# Patient Record
Sex: Female | Born: 1970 | Race: Black or African American | Hispanic: No | Marital: Single | Smoking: Never smoker
Health system: Southern US, Community
[De-identification: ages and names within clinical notes are randomized; demographics above are authoritative.]

## PROBLEM LIST (undated history)

## (undated) DIAGNOSIS — E079 Disorder of thyroid, unspecified: Secondary | ICD-10-CM

## (undated) DIAGNOSIS — I10 Essential (primary) hypertension: Secondary | ICD-10-CM

## (undated) HISTORY — DX: Essential (primary) hypertension: I10

## (undated) HISTORY — DX: Disorder of thyroid, unspecified: E07.9

---

## 2005-11-12 ENCOUNTER — Emergency Department: Payer: Self-pay | Admitting: Emergency Medicine

## 2006-09-04 ENCOUNTER — Ambulatory Visit: Payer: Self-pay | Admitting: General Surgery

## 2006-12-26 ENCOUNTER — Ambulatory Visit: Payer: Self-pay | Admitting: Family Medicine

## 2009-02-25 ENCOUNTER — Ambulatory Visit: Payer: Self-pay | Admitting: Oncology

## 2009-03-04 ENCOUNTER — Ambulatory Visit: Payer: Self-pay | Admitting: Oncology

## 2009-03-28 ENCOUNTER — Ambulatory Visit: Payer: Self-pay | Admitting: Oncology

## 2009-05-26 ENCOUNTER — Ambulatory Visit: Payer: Self-pay | Admitting: Oncology

## 2009-06-04 ENCOUNTER — Ambulatory Visit: Payer: Self-pay | Admitting: Oncology

## 2009-06-26 ENCOUNTER — Ambulatory Visit: Payer: Self-pay | Admitting: Oncology

## 2011-01-27 ENCOUNTER — Ambulatory Visit: Payer: Self-pay | Admitting: Family Medicine

## 2011-07-08 ENCOUNTER — Ambulatory Visit: Payer: Self-pay | Admitting: Family Medicine

## 2012-02-24 ENCOUNTER — Ambulatory Visit: Payer: Self-pay | Admitting: Family Medicine

## 2012-03-15 ENCOUNTER — Ambulatory Visit: Payer: Self-pay | Admitting: Family Medicine

## 2013-03-18 ENCOUNTER — Ambulatory Visit: Payer: Self-pay | Admitting: Family Medicine

## 2014-03-19 ENCOUNTER — Ambulatory Visit: Payer: Self-pay | Admitting: Family Medicine

## 2016-08-15 ENCOUNTER — Other Ambulatory Visit: Payer: Self-pay | Admitting: Family Medicine

## 2016-08-15 DIAGNOSIS — Z0000 Encounter for general adult medical examination without abnormal findings: Secondary | ICD-10-CM

## 2016-10-18 ENCOUNTER — Ambulatory Visit
Admission: RE | Admit: 2016-10-18 | Discharge: 2016-10-18 | Disposition: A | Discharge status: Home / Self Care | Payer: BLUE CROSS/BLUE SHIELD | Source: Ambulatory Visit | Attending: Family Medicine | Admitting: Family Medicine

## 2016-10-18 ENCOUNTER — Encounter: Payer: Self-pay | Admitting: Radiology

## 2016-10-18 DIAGNOSIS — Z1231 Encounter for screening mammogram for malignant neoplasm of breast: Secondary | ICD-10-CM | POA: Insufficient documentation

## 2016-10-18 DIAGNOSIS — Z0000 Encounter for general adult medical examination without abnormal findings: Secondary | ICD-10-CM

## 2018-03-02 ENCOUNTER — Other Ambulatory Visit: Payer: Self-pay | Admitting: Family Medicine

## 2018-03-02 DIAGNOSIS — Z1231 Encounter for screening mammogram for malignant neoplasm of breast: Secondary | ICD-10-CM

## 2018-04-12 ENCOUNTER — Ambulatory Visit
Admission: RE | Admit: 2018-04-12 | Discharge: 2018-04-12 | Disposition: A | Discharge status: Home / Self Care | Payer: BLUE CROSS/BLUE SHIELD | Source: Ambulatory Visit | Attending: Family Medicine | Admitting: Family Medicine

## 2018-04-12 DIAGNOSIS — Z1231 Encounter for screening mammogram for malignant neoplasm of breast: Secondary | ICD-10-CM

## 2019-04-17 ENCOUNTER — Other Ambulatory Visit: Payer: Self-pay | Admitting: Family Medicine

## 2019-04-17 DIAGNOSIS — Z1231 Encounter for screening mammogram for malignant neoplasm of breast: Secondary | ICD-10-CM

## 2019-05-13 ENCOUNTER — Ambulatory Visit
Admission: RE | Admit: 2019-05-13 | Discharge: 2019-05-13 | Disposition: A | Discharge status: Home / Self Care | Payer: BC Managed Care – PPO | Source: Ambulatory Visit | Attending: Family Medicine | Admitting: Family Medicine

## 2019-05-13 DIAGNOSIS — Z1231 Encounter for screening mammogram for malignant neoplasm of breast: Secondary | ICD-10-CM | POA: Diagnosis not present

## 2020-04-08 ENCOUNTER — Other Ambulatory Visit: Payer: Self-pay | Admitting: Family Medicine

## 2020-04-08 DIAGNOSIS — Z1231 Encounter for screening mammogram for malignant neoplasm of breast: Secondary | ICD-10-CM

## 2020-05-18 ENCOUNTER — Other Ambulatory Visit: Payer: Self-pay

## 2020-05-18 ENCOUNTER — Ambulatory Visit
Admission: RE | Admit: 2020-05-18 | Discharge: 2020-05-18 | Disposition: A | Discharge status: Home / Self Care | Payer: BC Managed Care – PPO | Source: Ambulatory Visit | Attending: Family Medicine | Admitting: Family Medicine

## 2020-05-18 DIAGNOSIS — Z1231 Encounter for screening mammogram for malignant neoplasm of breast: Secondary | ICD-10-CM | POA: Diagnosis present

## 2022-02-01 ENCOUNTER — Encounter: Payer: Self-pay | Admitting: Family Medicine

## 2022-02-01 DIAGNOSIS — Z1231 Encounter for screening mammogram for malignant neoplasm of breast: Secondary | ICD-10-CM

## 2022-02-08 ENCOUNTER — Encounter: Payer: Self-pay | Admitting: Family Medicine

## 2022-03-16 ENCOUNTER — Other Ambulatory Visit: Payer: Self-pay

## 2022-03-16 DIAGNOSIS — Z1231 Encounter for screening mammogram for malignant neoplasm of breast: Secondary | ICD-10-CM

## 2022-04-12 ENCOUNTER — Ambulatory Visit: Payer: Self-pay

## 2022-05-02 ENCOUNTER — Ambulatory Visit: Payer: Self-pay

## 2022-05-23 ENCOUNTER — Ambulatory Visit
Admission: RE | Admit: 2022-05-23 | Discharge: 2022-05-23 | Disposition: A | Discharge status: Home / Self Care | Payer: Self-pay | Source: Ambulatory Visit | Attending: Obstetrics and Gynecology | Admitting: Obstetrics and Gynecology

## 2022-05-23 ENCOUNTER — Ambulatory Visit: Payer: Self-pay | Attending: Hematology and Oncology | Admitting: Hematology and Oncology

## 2022-05-23 VITALS — BP 138/81 | Wt 140.9 lb

## 2022-05-23 DIAGNOSIS — Z1231 Encounter for screening mammogram for malignant neoplasm of breast: Secondary | ICD-10-CM

## 2022-05-23 NOTE — Patient Instructions (Signed)
Taught Mary Wu Abide about self breast awareness and gave educational materials to take home. Patient did not need a Pap smear today due to last Pap smear was in 2021 per patient. Let her know [facility] will cover Pap smears every 5 years unless has a history of abnormal Pap smears. [facility]. Appointment scheduled for 05/23/22. Patient aware of appointment and will be there. Let patient know will follow up with her within the next couple weeks with results. Darely Lavette Nagy verbalized understanding.  [name], NP 3:04 PM

## 2022-05-23 NOTE — Progress Notes (Signed)
Mary Wu is a 52 y.o. female who presents to [facility] clinic today with no complaints.    Pap Smear: Pap not smear completed today. Last Pap smear was 2021 at [facility] clinic and was normal. She will follow with Mary Wu. Per patient has no history of an abnormal Pap smear. Last Pap smear result is available in Epic.   Physical exam: Breasts Breasts symmetrical. No skin abnormalities bilateral breasts. No nipple retraction bilateral breasts. No nipple discharge bilateral breasts. No lymphadenopathy. No lumps palpated bilateral breasts. MM 3D SCREEN BREAST BILATERAL  Result Date: 05/21/2020 CLINICAL DATA:  Screening. EXAM: DIGITAL SCREENING BILATERAL MAMMOGRAM WITH TOMOSYNTHESIS AND CAD TECHNIQUE: Bilateral screening digital craniocaudal and mediolateral oblique mammograms were obtained. Bilateral screening digital breast tomosynthesis was performed. The images were evaluated with computer-aided detection. COMPARISON:  Previous exam(s). ACR Breast Density Category b: There are scattered areas of fibroglandular density. FINDINGS: There are no findings suspicious for malignancy. IMPRESSION: No mammographic evidence of malignancy. A result letter of this screening mammogram will be mailed directly to the patient. RECOMMENDATION: Screening mammogram in one year. (Code:SM-B-01Y) BI-RADS CATEGORY  1: Negative. Electronically Signed   By: Claudie Revering M.D.   On: 05/21/2020 17:30   MM 3D SCREEN BREAST BILATERAL  Result Date: 05/14/2019 CLINICAL DATA:  Screening. EXAM: DIGITAL SCREENING BILATERAL MAMMOGRAM WITH TOMO AND CAD COMPARISON:  Previous exam(s). ACR Breast Density Category b: There are scattered areas of fibroglandular density. FINDINGS: There are no findings suspicious for malignancy. Images were processed with CAD. IMPRESSION: No mammographic evidence of malignancy. A result letter of this screening mammogram will be mailed directly to the patient. RECOMMENDATION: Screening  mammogram in one year. (Code:SM-B-01Y) BI-RADS CATEGORY  1: Negative. Electronically Signed   By: Nolon Nations M.D.   On: 05/14/2019 09:26   MM 3D SCREEN BREAST BILATERAL  Result Date: 04/13/2018 CLINICAL DATA:  Screening. EXAM: DIGITAL SCREENING BILATERAL MAMMOGRAM WITH TOMO AND CAD COMPARISON:  Previous exam(s). ACR Breast Density Category c: The breast tissue is heterogeneously dense, which may obscure small masses. FINDINGS: There are no findings suspicious for malignancy. Images were processed with CAD. IMPRESSION: No mammographic evidence of malignancy. A result letter of this screening mammogram will be mailed directly to the patient. RECOMMENDATION: Screening mammogram in one year. (Code:[id number]-01Y) BI-RADS CATEGORY  1: Negative. Electronically Signed   By: Lillia Mountain M.D.   On: 04/13/2018 10:05          Pelvic/Bimanual Pap is not indicated today    Smoking History: Patient has never smoked and was not referred to quit line.    Patient Navigation: Patient education provided. Access to services provided for patient through [facility] program. No interpreter provided. No transportation provided   Colorectal Cancer Screening: Per patient has never had colonoscopy completed No complaints today.    Breast and Cervical Cancer Risk Assessment: Patient does not have family history of breast cancer, known genetic mutations, or radiation treatment to the chest before age 77. Patient does not have history of cervical dysplasia, immunocompromised, or DES exposure in-utero.  Risk Assessment   No risk assessment data     A: [facility] exam without pap smear No complaints with benign exam.   P: [facility]. Appointment scheduled 05/23/22.  Mary Ped, NP 05/23/2022 3:03 PM

## 2022-08-03 ENCOUNTER — Telehealth: Payer: Self-pay | Admitting: Hematology and Oncology

## 2022-08-15 ENCOUNTER — Ambulatory Visit: Payer: Self-pay

## 2022-11-06 IMAGING — MG MM DIGITAL SCREENING BILAT W/ TOMO AND CAD
8 series · 8 of 24 positions shown · non-contrast
Comparison: Previous exam(s).

CLINICAL DATA: Screening.

EXAM:
DIGITAL SCREENING BILATERAL MAMMOGRAM WITH TOMOSYNTHESIS AND CAD
TECHNIQUE: Bilateral screening digital craniocaudal and mediolateral oblique
mammograms were obtained. Bilateral screening digital breast
tomosynthesis was performed. The images were evaluated with
computer-aided detection.

[L MLO synth-2D]
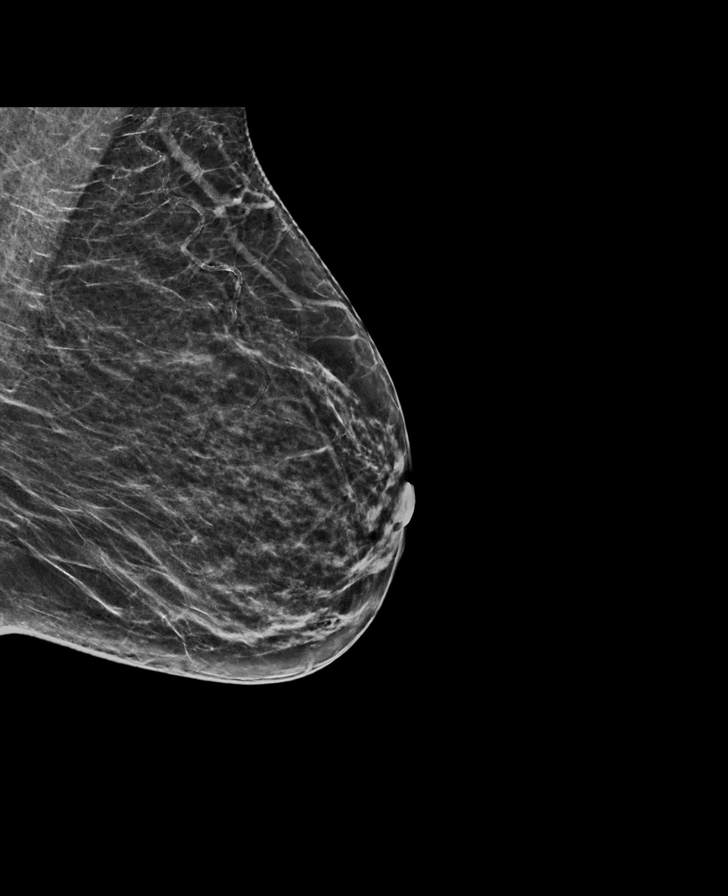

[R MLO synth-2D]
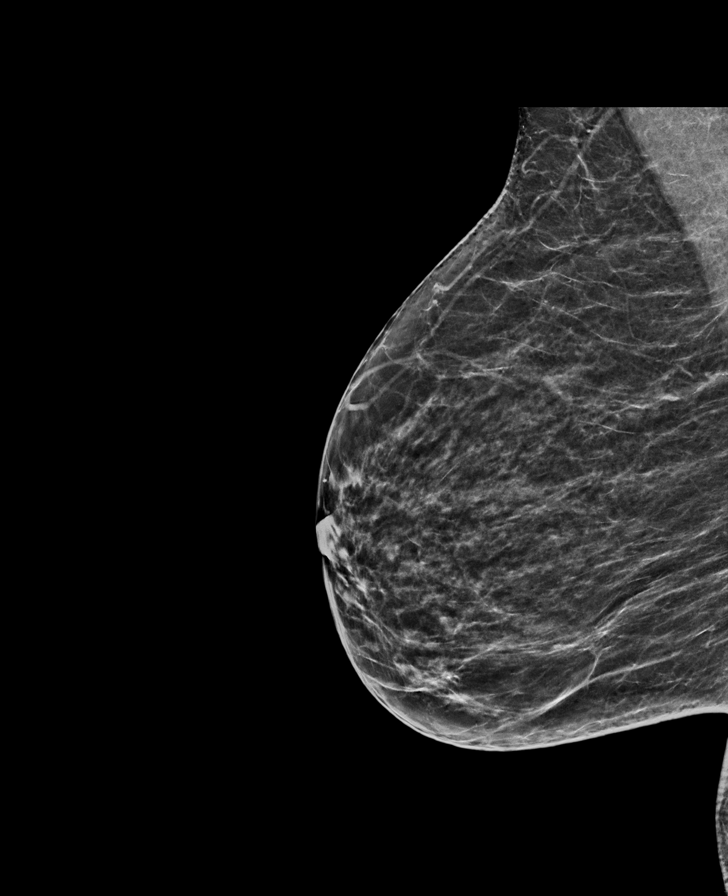

[R CC synth-2D]
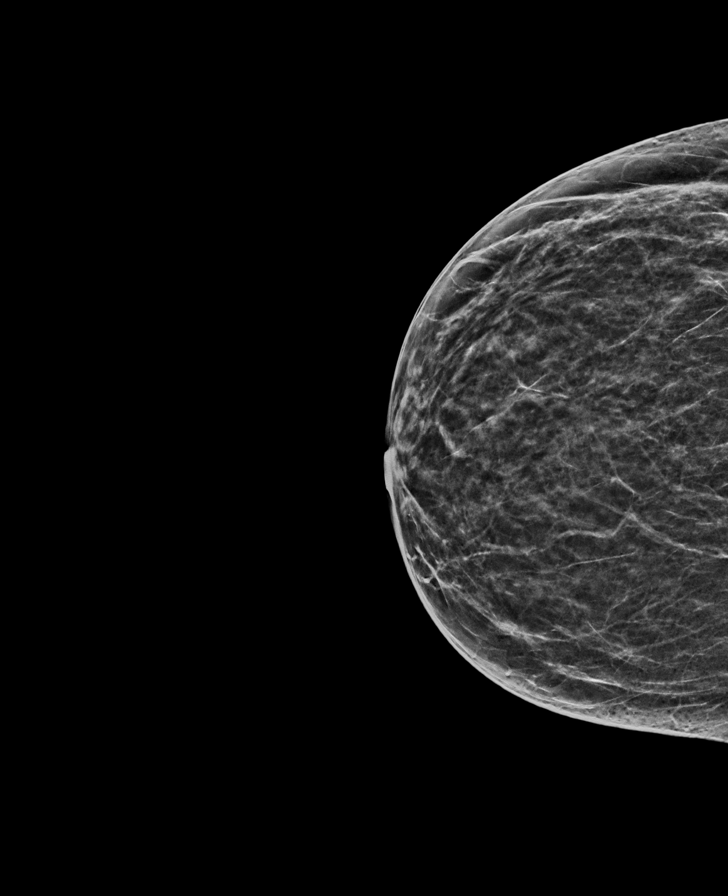

[L CC synth-2D]
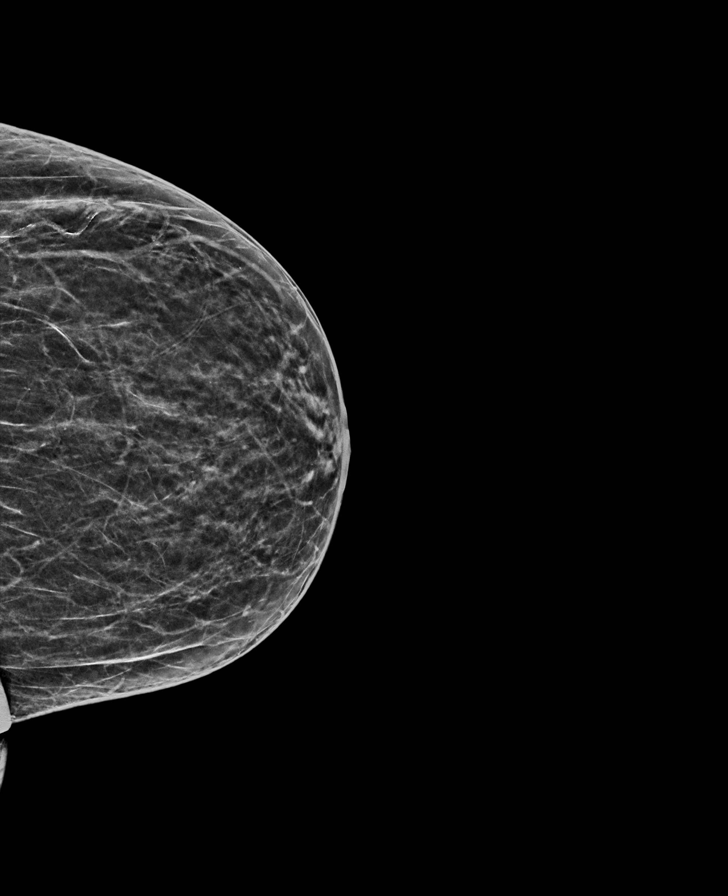

[L CC tomo · tomo slice 27/54.0]
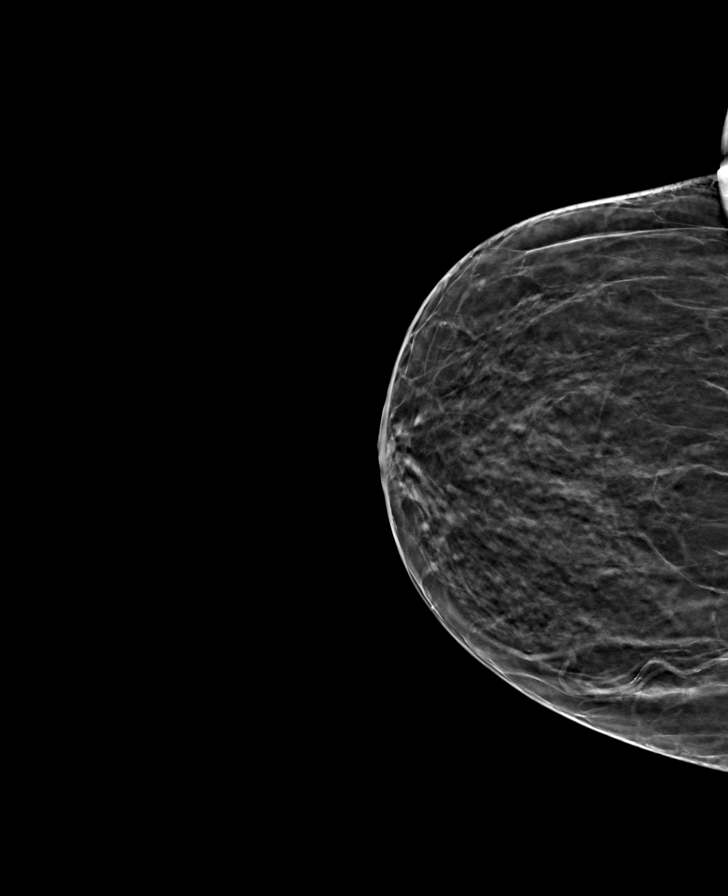

[R MLO tomo · tomo slice 31/61.0]
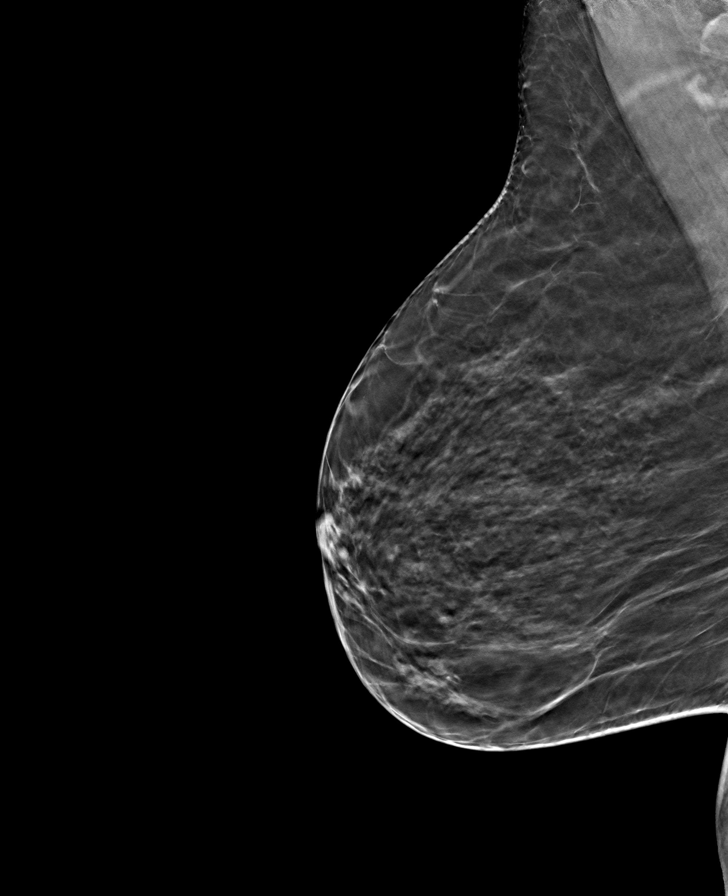

[L MLO tomo · tomo slice 32/63.0]
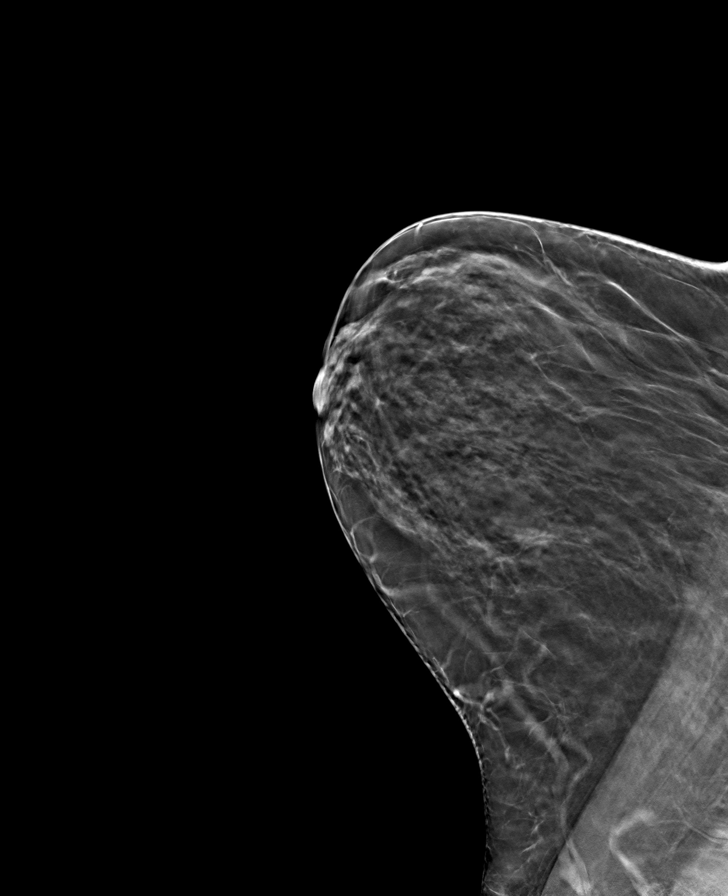

[R CC tomo · tomo slice 27/52.0]
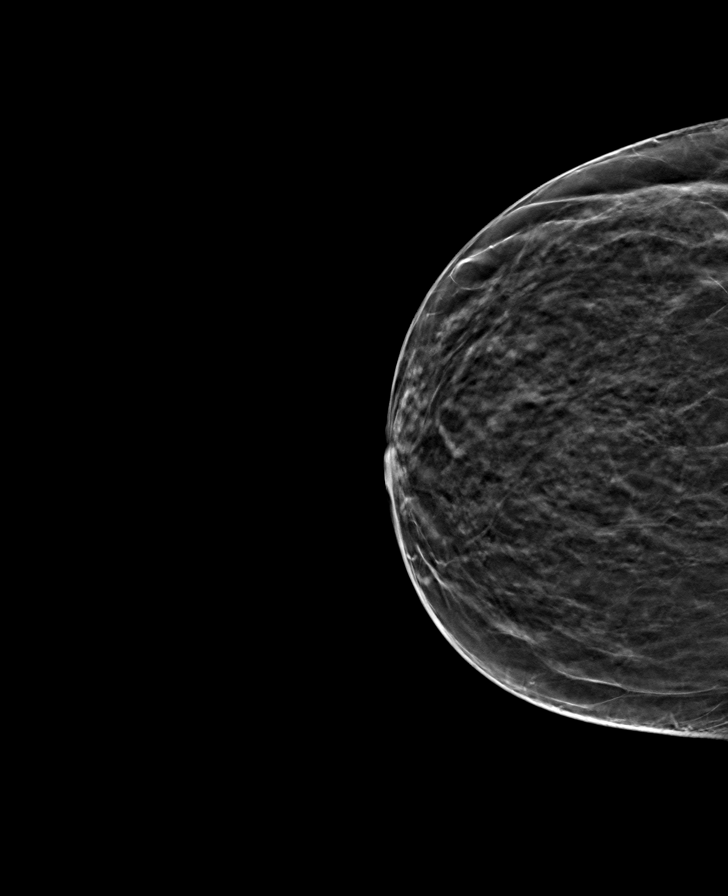

[8 of 24 positions shown; findings below may reference images not displayed]

ACR Breast Density Category b: There are scattered areas of
fibroglandular density.
FINDINGS: There are no findings suspicious for malignancy.
IMPRESSION: No mammographic evidence of malignancy. A result letter of this
screening mammogram will be mailed directly to the patient.

RECOMMENDATION:
Screening mammogram in one year. (Code:51-[id number])

BI-RADS CATEGORY  1: Negative.
